# Patient Record
Sex: Male | Born: 1950 | Race: Black or African American | Hispanic: No | Marital: Married | Smoking: Never smoker
Health system: Southern US, Community
[De-identification: ages and names within clinical notes are randomized; demographics above are authoritative.]

## PROBLEM LIST (undated history)

## (undated) DIAGNOSIS — C61 Malignant neoplasm of prostate: Secondary | ICD-10-CM

## (undated) DIAGNOSIS — I10 Essential (primary) hypertension: Secondary | ICD-10-CM

---

## 2022-01-09 ENCOUNTER — Emergency Department (HOSPITAL_BASED_OUTPATIENT_CLINIC_OR_DEPARTMENT_OTHER)
Admission: EM | Admit: 2022-01-09 | Discharge: 2022-01-10 | Disposition: A | Discharge status: Home / Self Care | Payer: Medicare Other | Attending: Emergency Medicine | Admitting: Emergency Medicine

## 2022-01-09 ENCOUNTER — Emergency Department (HOSPITAL_COMMUNITY): Payer: Medicare Other

## 2022-01-09 ENCOUNTER — Other Ambulatory Visit: Payer: Self-pay

## 2022-01-09 ENCOUNTER — Encounter (HOSPITAL_BASED_OUTPATIENT_CLINIC_OR_DEPARTMENT_OTHER): Payer: Self-pay | Admitting: Emergency Medicine

## 2022-01-09 ENCOUNTER — Emergency Department (HOSPITAL_BASED_OUTPATIENT_CLINIC_OR_DEPARTMENT_OTHER): Payer: Medicare Other

## 2022-01-09 DIAGNOSIS — I10 Essential (primary) hypertension: Secondary | ICD-10-CM | POA: Diagnosis not present

## 2022-01-09 DIAGNOSIS — R791 Abnormal coagulation profile: Secondary | ICD-10-CM | POA: Diagnosis not present

## 2022-01-09 DIAGNOSIS — R262 Difficulty in walking, not elsewhere classified: Secondary | ICD-10-CM | POA: Insufficient documentation

## 2022-01-09 DIAGNOSIS — Z79899 Other long term (current) drug therapy: Secondary | ICD-10-CM | POA: Insufficient documentation

## 2022-01-09 DIAGNOSIS — R42 Dizziness and giddiness: Secondary | ICD-10-CM | POA: Insufficient documentation

## 2022-01-09 DIAGNOSIS — I6521 Occlusion and stenosis of right carotid artery: Secondary | ICD-10-CM | POA: Insufficient documentation

## 2022-01-09 DIAGNOSIS — R112 Nausea with vomiting, unspecified: Secondary | ICD-10-CM | POA: Diagnosis not present

## 2022-01-09 DIAGNOSIS — R270 Ataxia, unspecified: Secondary | ICD-10-CM

## 2022-01-09 HISTORY — DX: Malignant neoplasm of prostate: C61

## 2022-01-09 HISTORY — DX: Essential (primary) hypertension: I10

## 2022-01-09 LAB — CBC
HCT: 34.9 % — ABNORMAL LOW (ref 39.0–52.0)
Hemoglobin: 11.4 g/dL — ABNORMAL LOW (ref 13.0–17.0)
MCH: 29.2 pg (ref 26.0–34.0)
MCHC: 32.7 g/dL (ref 30.0–36.0)
MCV: 89.5 fL (ref 80.0–100.0)
Platelets: 207 10*3/uL (ref 150–400)
RBC: 3.9 MIL/uL — ABNORMAL LOW (ref 4.22–5.81)
RDW: 16.7 % — ABNORMAL HIGH (ref 11.5–15.5)
WBC: 5.2 10*3/uL (ref 4.0–10.5)
nRBC: 0 % (ref 0.0–0.2)

## 2022-01-09 LAB — DIFFERENTIAL
Abs Immature Granulocytes: 0.03 10*3/uL (ref 0.00–0.07)
Basophils Absolute: 0 10*3/uL (ref 0.0–0.1)
Basophils Relative: 0 %
Eosinophils Absolute: 0 10*3/uL (ref 0.0–0.5)
Eosinophils Relative: 0 %
Immature Granulocytes: 1 %
Lymphocytes Relative: 5 %
Lymphs Abs: 0.3 10*3/uL — ABNORMAL LOW (ref 0.7–4.0)
Monocytes Absolute: 0 10*3/uL — ABNORMAL LOW (ref 0.1–1.0)
Monocytes Relative: 0 %
Neutro Abs: 4.8 10*3/uL (ref 1.7–7.7)
Neutrophils Relative %: 94 %

## 2022-01-09 LAB — RAPID URINE DRUG SCREEN, HOSP PERFORMED
Amphetamines: NOT DETECTED
Barbiturates: NOT DETECTED
Benzodiazepines: NOT DETECTED
Cocaine: NOT DETECTED
Opiates: NOT DETECTED
Tetrahydrocannabinol: NOT DETECTED

## 2022-01-09 LAB — CBG MONITORING, ED: Glucose-Capillary: 108 mg/dL — ABNORMAL HIGH (ref 70–99)

## 2022-01-09 LAB — COMPREHENSIVE METABOLIC PANEL
ALT: 9 U/L (ref 0–44)
AST: 12 U/L — ABNORMAL LOW (ref 15–41)
Albumin: 3.8 g/dL (ref 3.5–5.0)
Alkaline Phosphatase: 94 U/L (ref 38–126)
Anion gap: 12 (ref 5–15)
BUN: 13 mg/dL (ref 8–23)
CO2: 26 mmol/L (ref 22–32)
Calcium: 9.3 mg/dL (ref 8.9–10.3)
Chloride: 102 mmol/L (ref 98–111)
Creatinine, Ser: 0.61 mg/dL (ref 0.61–1.24)
GFR, Estimated: >60 mL/min (ref >60)
Glucose, Bld: 107 mg/dL — ABNORMAL HIGH (ref 70–99)
Potassium: 3.6 mmol/L (ref 3.5–5.1)
Sodium: 140 mmol/L (ref 135–145)
Total Bilirubin: 0.5 mg/dL (ref 0.3–1.2)
Total Protein: 6.7 g/dL (ref 6.5–8.1)

## 2022-01-09 LAB — URINALYSIS, ROUTINE W REFLEX MICROSCOPIC
Bilirubin Urine: NEGATIVE
Glucose, UA: NEGATIVE mg/dL
Ketones, ur: 15 mg/dL — AB
Leukocytes,Ua: NEGATIVE
Nitrite: NEGATIVE
Protein, ur: NEGATIVE mg/dL
Specific Gravity, Urine: 1.02 (ref 1.005–1.030)
pH: 7.5 (ref 5.0–8.0)

## 2022-01-09 LAB — PROTIME-INR
INR: 1 (ref 0.8–1.2)
Prothrombin Time: 13.5 seconds (ref 11.4–15.2)

## 2022-01-09 LAB — URINALYSIS, MICROSCOPIC (REFLEX)

## 2022-01-09 LAB — TROPONIN I (HIGH SENSITIVITY): Troponin I (High Sensitivity): 9 ng/L (ref <18)

## 2022-01-09 LAB — APTT: aPTT: 26 seconds (ref 24–36)

## 2022-01-09 LAB — ETHANOL: Alcohol, Ethyl (B): <10 mg/dL (ref <10)

## 2022-01-09 MED ORDER — MECLIZINE HCL 25 MG PO TABS
12.5000 mg | ORAL_TABLET | Freq: Once | ORAL | Status: AC
  Administered 2022-01-09: 12.5 mg via ORAL
  Filled 2022-01-09: qty 1

## 2022-01-09 MED ORDER — ONDANSETRON HCL 4 MG/2ML IJ SOLN: 4.0000 mg | Freq: Once | INTRAMUSCULAR | Status: DC

## 2022-01-09 NOTE — ED Notes (Signed)
Gave pt urinal. Pt not able to give urine sample at present time, but is aware of need for sample

## 2022-01-09 NOTE — ED Provider Notes (Signed)
  Provider Note MRN:  [id number]  Arrival date & time: 01/10/22    ED Course and Medical Decision Making  Assumed care from Dr. Wyvonnia Dusky at shift change.  Persistent vertigo with nausea vomiting since yesterday awaiting MRI.  MRI is without acute stroke, there is some atherosclerotic disease of the cerebral vessels but not in the posterior circulation.  Patient feeling well, suspect peripheral vertigo, ambulating without issue, appropriate for discharge.  Procedures  Final Clinical Impressions(s) / ED Diagnoses     ICD-10-CM   1. Dizzy  R42       ED Discharge Orders          Ordered    meclizine (ANTIVERT) 25 MG tablet  3 times daily PRN        01/10/22 0329              Discharge Instructions      You were evaluated in the Emergency Department and after careful evaluation, we did not find any emergent condition requiring admission or further testing in the hospital.  Your exam/testing today was overall reassuring.  MRI did not show evidence of stroke.  You do have some blood vessel disease in the blood vessels of your brain.  Importantly follow-up with your primary care doctor to discuss management of this.  Your symptoms may be due to vertigo, you can take the meclizine medication as needed.  Please return to the Emergency Department if you experience any worsening of your condition.  Thank you for allowing [place] to be a part of your care.       Barth Kirks. Sedonia Small, [place] [email or web address]    Maudie Flakes, MD 01/10/22 [id number]

## 2022-01-09 NOTE — ED Provider Notes (Signed)
Patient sent from [facility] for MRI brain.  Developed dizziness, nausea, vomiting, difficulty walking yesterday at 4 PM.  Reports dizziness and vomiting have improved but still having difficulty walking.  Sent for MRI brain to rule out posterior circulation infarct.  Positive Romberg, ataxic gait, no ataxia on finger-to-nose.  Awaiting MRI at shift change.  Dr. Sedonia Small to assume care.    Ezequiel Essex, MD 01/09/22 2316

## 2022-01-09 NOTE — ED Provider Notes (Signed)
[facility] EMERGENCY DEPARTMENT Provider Note   CSN: [id number] Arrival date & time: 01/09/22  1525     History  Chief Complaint  Patient presents with   Dizziness    Luke Leblanc is a 71 y.o. male.  He has a history of prostate cancer on chemotherapy and hypertension.  He said he was at the grocery store yesterday when he acutely became dizzy and had unsteady gait.  Needed to grab onto things to ambulate.  Was associated with nausea and vomiting.  Symptoms persisted throughout the evening and he had to crawl up the stairs on his hands and knees to go to bed.  Symptoms remain today although he feels somewhat better.  Not associated with any tenderness blurry vision double vision numbness or weakness.  No speech difficulties.  No headache.  No trauma.  No recent illnesses no medication changes.  He gets chemotherapy every 3 weeks no recent change in any meds.  No prior history of stroke.  The history is provided by the patient.  Dizziness Quality:  Room spinning Severity:  Severe Onset quality:  Sudden Duration:  24 hours Timing:  Constant Progression:  Improving Chronicity:  New Relieved by:  Nothing Worsened by:  Nothing Ineffective treatments:  Lying down Associated symptoms: nausea and vomiting   Associated symptoms: no chest pain, no diarrhea, no headaches, no hearing loss, no shortness of breath, no syncope, no tinnitus, no vision changes and no weakness       Home Medications Prior to Admission medications   Not on File      Allergies    Patient has no known allergies.    Review of Systems   Review of Systems  Constitutional:  Negative for fever.  HENT:  Negative for hearing loss, sore throat and tinnitus.   Eyes:  Negative for visual disturbance.  Respiratory:  Negative for shortness of breath.   Cardiovascular:  Negative for chest pain and syncope.  Gastrointestinal:  Positive for nausea and vomiting. Negative for abdominal pain and diarrhea.   Genitourinary:  Negative for dysuria.  Musculoskeletal:  Positive for gait problem.  Skin:  Negative for rash.  Neurological:  Positive for dizziness. Negative for syncope, speech difficulty, weakness, numbness and headaches.   Physical Exam Updated Vital Signs BP (!) 192/112 (BP Location: Left Arm)   Pulse 99   Temp 98.1 F (36.7 C) (Oral)   Resp 18   SpO2 99%  Physical Exam Vitals and nursing note reviewed.  Constitutional:      General: He is not in acute distress.    Appearance: Normal appearance. He is well-developed.  HENT:     Head: Normocephalic and atraumatic.  Eyes:     Conjunctiva/sclera: Conjunctivae normal.  Cardiovascular:     Rate and Rhythm: Normal rate and regular rhythm.     Pulses: Normal pulses.     Heart sounds: Normal heart sounds. No murmur heard. Pulmonary:     Effort: Pulmonary effort is normal. No respiratory distress.     Breath sounds: Normal breath sounds.  Abdominal:     Palpations: Abdomen is soft.     Tenderness: There is no abdominal tenderness. There is no guarding or rebound.  Musculoskeletal:        General: No swelling.     Cervical back: Neck supple.  Skin:    General: Skin is warm and dry.     Capillary Refill: Capillary refill takes less than 2 seconds.  Neurological:     Mental  Status: He is alert.     Cranial Nerves: No cranial nerve deficit.     Sensory: No sensory deficit.     Motor: No weakness.     Coordination: Coordination normal.     Gait: Gait abnormal.     Comments: Patient finger-nose and heel shin were normal although he has a wide-based gait and he is unable to heel toe walk    ED Results / Procedures / Treatments   Labs (all labs ordered are listed, but only abnormal results are displayed) Labs Reviewed  CBC - Abnormal; Notable for the following components:      Result Value   RBC 3.90 (*)    Hemoglobin 11.4 (*)    HCT 34.9 (*)    RDW 16.7 (*)    All other components within normal limits  DIFFERENTIAL  - Abnormal; Notable for the following components:   Lymphs Abs 0.3 (*)    Monocytes Absolute 0.0 (*)    All other components within normal limits  COMPREHENSIVE METABOLIC PANEL - Abnormal; Notable for the following components:   Glucose, Bld 107 (*)    AST 12 (*)    All other components within normal limits  URINALYSIS, ROUTINE W REFLEX MICROSCOPIC - Abnormal; Notable for the following components:   Hgb urine dipstick SMALL (*)    Ketones, ur 15 (*)    All other components within normal limits  URINALYSIS, MICROSCOPIC (REFLEX) - Abnormal; Notable for the following components:   Bacteria, UA MANY (*)    All other components within normal limits  CBG MONITORING, ED - Abnormal; Notable for the following components:   Glucose-Capillary 108 (*)    All other components within normal limits  URINE CULTURE  ETHANOL  PROTIME-INR  APTT  RAPID URINE DRUG SCREEN, HOSP PERFORMED  TROPONIN I (HIGH SENSITIVITY)  TROPONIN I (HIGH SENSITIVITY)    EKG EKG Interpretation  Date/Time:  Sunday Jan 09 2022 15:44:21 EDT Ventricular Rate:  79 PR Interval:  192 QRS Duration: 104 QT Interval:  418 QTC Calculation: 480 R Axis:   -28 Text Interpretation: Sinus rhythm Borderline left axis deviation Abnormal R-wave progression, early transition ST elevation, consider inferior injury Borderline prolonged QT interval No old tracing to compare Confirmed by Aletta Edouard [phone]) on 01/09/2022 4:02:15 PM  Radiology CT Head Wo Contrast  Result Date: 01/09/2022 CLINICAL DATA:  Dizziness, generalized weakness, loss of balance since 4 p.m., history of prostate cancer EXAM: CT HEAD WITHOUT CONTRAST TECHNIQUE: Contiguous axial images were obtained from the base of the skull through the vertex without intravenous contrast. RADIATION DOSE REDUCTION: This exam was performed according to the departmental dose-optimization program which includes automated exposure control, adjustment of the mA and/or kV according to  patient size and/or use of iterative reconstruction technique. COMPARISON:  03/27/2020 FINDINGS: Brain: Confluent hypodensities are seen throughout the periventricular and subcortical white matter, consistent with chronic small vessel ischemic changes. Frontal lobe hypodensities are unchanged since prior facial CT 03/27/2020. No evidence of acute infarct or hemorrhage. Lateral ventricles and midline structures are otherwise unremarkable. No acute extra-axial fluid collections. No mass effect. Vascular: Stable atherosclerosis of the internal carotid arteries. No hyperdense vessel. Skull: Normal. Negative for fracture or focal lesion. Sinuses/Orbits: Mild mucosal thickening left ethmoid air cells. No gas fluid levels. Other: None. IMPRESSION: 1. Likely chronic small-vessel ischemic changes throughout the periventricular and subcortical white matter. 2. No acute intracranial process. Electronically Signed   By: Randa Ngo M.D.   On: 01/09/2022 16:26  Procedures Procedures    Medications Ordered in ED Medications  meclizine (ANTIVERT) tablet 12.5 mg (has no administration in time range)    ED Course/ Medical Decision Making/ A&P Clinical Course as of 01/09/22 1754  Sun Jan 09, 2022  1641 Reviewed case with Dr. Quinn Axe neurology.  She agrees that an MRI would be indicated.  Symptoms been going on long enough that if MRI is negative he can be considered peripheral and discharged if symptoms control.  Discussed with Dr. Wyvonnia Dusky ED physician at [facility] who accepts the patient for transfer. [MB]  1700 Reviewed work-up with patient and family.  They would rather go by private vehicle over to [facility] to get the MRI.  We will give dose of meclizine here. [MB]    Clinical Course User Index [MB] Hayden Rasmussen, MD                           Medical Decision Making Amount and/or Complexity of Data Reviewed Labs: ordered. Radiology: ordered.  Risk Prescription drug management.  This patient complains of  dizziness vertigo unsteady gait nausea vomiting; this involves an extensive number of treatment Options and is a complaint that carries with it a high risk of complications and morbidity. The differential includes vertigo, posterior circulation stroke, bleed, tumor  I ordered, reviewed and interpreted labs, which included CBC with normal white count, hemoglobin slightly low unclear priors, chemistries fairly normal, urinalysis with some ketones, troponin unremarkable I ordered medication oral meclizine and reviewed PMP when indicated. I ordered imaging studies which included CT head and MRI brain and I independently    visualized and interpreted imaging which CT showed some white matter changes.  Will need transfer for MRI. Additional history obtained from patient's family members Previous records obtained and reviewed in epic no recent admissions I consulted Dr. Quinn Axe neurology and discussed lab and imaging findings and discussed disposition.  Cardiac monitoring reviewed, sinus rhythm Social determinants considered, no significant barriers Critical Interventions: None  After the interventions stated above, I reevaluated the patient and found patient still to be symptomatic with any type of ambulation but very comfortable in bed Admission and further testing considered, will need transfer over to [facility] to get an MRI.  Final disposition dependent on MRI results          Final Clinical Impression(s) / ED Diagnoses Final diagnoses:  Dizzy    Rx / DC Orders ED Discharge Orders          Ordered    meclizine (ANTIVERT) 25 MG tablet  3 times daily PRN        01/10/22 0329              Hayden Rasmussen, MD 01/10/22 [id number]

## 2022-01-09 NOTE — ED Triage Notes (Addendum)
Pt reports dizziness, generalized weakness, and balance issues that started yesterday while at the grocery store around 4 PM.

## 2022-01-09 NOTE — ED Notes (Signed)
Pt transported to CT ?

## 2022-01-09 NOTE — Discharge Instructions (Addendum)
You were evaluated in the Emergency Department and after careful evaluation, we did not find any emergent condition requiring admission or further testing in the hospital.  Your exam/testing today was overall reassuring.  MRI did not show evidence of stroke.  You do have some blood vessel disease in the blood vessels of your brain.  Importantly follow-up with your primary care doctor to discuss management of this.  Your symptoms may be due to vertigo, you can take the meclizine medication as needed.  Please return to the Emergency Department if you experience any worsening of your condition.  Thank you for allowing [place] to be a part of your care.

## 2022-01-09 NOTE — ED Notes (Signed)
Updated pt on plan of care, pt expressed being upset at waiting, RN informed pt of ER process. Pt accepting

## 2022-01-10 ENCOUNTER — Emergency Department (HOSPITAL_COMMUNITY): Payer: Medicare Other

## 2022-01-10 MED ORDER — MECLIZINE HCL 25 MG PO TABS: 25.0000 mg | ORAL_TABLET | Freq: Three times a day (TID) | ORAL | 0 refills | Status: AC | PRN

## 2022-01-10 MED ORDER — GADOBUTROL 1 MMOL/ML IV SOLN
8.0000 mL | Freq: Once | INTRAVENOUS | Status: AC | PRN
  Administered 2022-01-10: 8 mL via INTRAVENOUS

## 2022-01-10 NOTE — ED Notes (Signed)
Patient transported to MRI 

## 2022-01-17 ENCOUNTER — Other Ambulatory Visit (HOSPITAL_COMMUNITY): Payer: Self-pay | Admitting: Hematology and Oncology

## 2022-01-17 DIAGNOSIS — C61 Malignant neoplasm of prostate: Secondary | ICD-10-CM

## 2022-01-17 DIAGNOSIS — C7951 Secondary malignant neoplasm of bone: Secondary | ICD-10-CM

## 2022-01-20 ENCOUNTER — Other Ambulatory Visit (HOSPITAL_COMMUNITY): Payer: Self-pay | Admitting: Hematology and Oncology

## 2022-01-20 DIAGNOSIS — C7951 Secondary malignant neoplasm of bone: Secondary | ICD-10-CM

## 2022-01-20 DIAGNOSIS — C61 Malignant neoplasm of prostate: Secondary | ICD-10-CM

## 2022-01-31 ENCOUNTER — Other Ambulatory Visit (HOSPITAL_COMMUNITY): Payer: Self-pay | Admitting: Hematology and Oncology

## 2022-01-31 ENCOUNTER — Ambulatory Visit (HOSPITAL_COMMUNITY)
Admission: RE | Admit: 2022-01-31 | Discharge: 2022-01-31 | Disposition: A | Discharge status: Home / Self Care | Payer: Medicare Other | Source: Ambulatory Visit | Attending: Hematology and Oncology | Admitting: Hematology and Oncology

## 2022-01-31 DIAGNOSIS — C61 Malignant neoplasm of prostate: Secondary | ICD-10-CM

## 2022-01-31 DIAGNOSIS — C7951 Secondary malignant neoplasm of bone: Secondary | ICD-10-CM | POA: Insufficient documentation

## 2022-03-03 NOTE — Written Directive (Addendum)
  PLUVICTO  THERAPY   RADIOPHARMACEUTICAL: Lutetium 177 vipivotide tetraxetan (Pluvicto)     PRESCRIBED DOSE FOR ADMINISTRATION:  200 mCi   ROUTE OFADMINISTRATION:  IV   DIAGNOSIS:  Prostate cancer metastatic to bone   REFERRING PHYSICIAN: Jacqulyn Ducking MD   TREATMENT #: 1   ADDITIONAL PHYSICIAN COMMENTS/NOTES:    AUTHORIZED USER SIGNATURE & TIME STAMP: Rennis Golden, MD   03/08/22    8:55 AM

## 2022-03-09 ENCOUNTER — Ambulatory Visit (HOSPITAL_COMMUNITY)
Admission: RE | Admit: 2022-03-09 | Discharge: 2022-03-09 | Disposition: A | Discharge status: Home / Self Care | Payer: Medicare Other | Source: Ambulatory Visit | Attending: Hematology and Oncology | Admitting: Hematology and Oncology

## 2022-03-09 DIAGNOSIS — C61 Malignant neoplasm of prostate: Secondary | ICD-10-CM | POA: Insufficient documentation

## 2022-03-09 DIAGNOSIS — C7951 Secondary malignant neoplasm of bone: Secondary | ICD-10-CM | POA: Insufficient documentation

## 2022-03-09 LAB — CBC WITH DIFFERENTIAL/PLATELET
Abs Immature Granulocytes: 0.05 10*3/uL (ref 0.00–0.07)
Basophils Absolute: 0 10*3/uL (ref 0.0–0.1)
Basophils Relative: 0 %
Eosinophils Absolute: 0.1 10*3/uL (ref 0.0–0.5)
Eosinophils Relative: 1 %
HCT: 36.1 % — ABNORMAL LOW (ref 39.0–52.0)
Hemoglobin: 11.6 g/dL — ABNORMAL LOW (ref 13.0–17.0)
Immature Granulocytes: 1 %
Lymphocytes Relative: 21 %
Lymphs Abs: 1.4 10*3/uL (ref 0.7–4.0)
MCH: 28.8 pg (ref 26.0–34.0)
MCHC: 32.1 g/dL (ref 30.0–36.0)
MCV: 89.6 fL (ref 80.0–100.0)
Monocytes Absolute: 0.4 10*3/uL (ref 0.1–1.0)
Monocytes Relative: 6 %
Neutro Abs: 4.6 10*3/uL (ref 1.7–7.7)
Neutrophils Relative %: 71 %
Platelets: 231 10*3/uL (ref 150–400)
RBC: 4.03 MIL/uL — ABNORMAL LOW (ref 4.22–5.81)
RDW: 15.2 % (ref 11.5–15.5)
WBC: 6.5 10*3/uL (ref 4.0–10.5)
nRBC: 0 % (ref 0.0–0.2)

## 2022-03-09 LAB — BASIC METABOLIC PANEL
Anion gap: 9 (ref 5–15)
BUN: 12 mg/dL (ref 8–23)
CO2: 27 mmol/L (ref 22–32)
Calcium: 8.2 mg/dL — ABNORMAL LOW (ref 8.9–10.3)
Chloride: 107 mmol/L (ref 98–111)
Creatinine, Ser: 0.7 mg/dL (ref 0.61–1.24)
GFR, Estimated: >60 mL/min (ref >60)
Glucose, Bld: 72 mg/dL (ref 70–99)
Potassium: 3.2 mmol/L — ABNORMAL LOW (ref 3.5–5.1)
Sodium: 143 mmol/L (ref 135–145)

## 2022-03-09 MED ORDER — SODIUM CHLORIDE 0.9 % IV BOLUS: 1000.0000 mL | Freq: Once | INTRAVENOUS | Status: DC

## 2022-03-09 MED ORDER — LUTETIUM LU 177 VIPIVOTIDE TET 1000 MBQ/ML IV SOLN
198.4000 | Freq: Once | INTRAVENOUS | Status: AC
  Administered 2022-03-09: 198.4 via INTRAVENOUS

## 2022-03-09 NOTE — Progress Notes (Signed)
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  Patient status post androgen deprivation and taxane chemotherapy..  Radiotracer avid metastatic prostate carcinoma identified on PSMA PET scan within the bones and pelvic lymph nodes.] EXAM: NUCLEAR MEDICINE PLUVICTO INJECTION TECHNIQUE: Infusion: The nuclear medicine technologist and I personally verified the dose activity to be delivered as specified in the written directive, and verified the patient identification via 2 separate methods.  Initial flush of the intravenous catheter was performed was sterile saline. The dose syringe was connected to the catheter and the Lu-177 Pluvicto administered over a 1 to 10 min infusion. Single 10 cc  lushes with normal saline follow the dose. No complications were noted. The entire IV tubing, venocatheter, stopcock and syringes was removed in total, placed in a disposal bag and sent for assay of the residual activity, which will be reported at a later time in our EMR by the physics staff. Pressure was applied to the venipuncture site, and a compression bandage placed. Patient monitored for 1 hour following infusion.   Radiation Safety personnel were present to perform the discharge survey, as detailed on their documentation. After a short period of observation, the patient had his IV removed. RADIOPHARMACEUTICALS: [198.4] microcuries Lu-177 PLUVICTO FINDINGS: Current Infusion: [1] Planned Infusions: 6   Patient presented to nuclear medicine for treatment. The patient's most recent blood counts were reviewed and remains a good candidate to proceed with Lu-177 Pluvicto.  Patient is slightly anemic.  Red blood cell counts will be followed.  The patient was situated in an infusion suite with a contact barrier placed under the arm. Intravenous access was established, using sterile technique, and a normal saline infusion from a syringe was started.     Micro-dosimetry: The prescribed radiation activity was assayed and confirmed to be within specified tolerance. IMPRESSION: Current Infusion: [1] Planned Infusions: 6   [The patient tolerated the infusion well. The patient will return in 6 weeks for ongoing care.]

## 2022-03-10 ENCOUNTER — Other Ambulatory Visit (HOSPITAL_COMMUNITY): Payer: Medicare Other

## 2022-04-13 NOTE — Written Directive (Cosign Needed)
  PLUVICTO  THERAPY   RADIOPHARMACEUTICAL: Lutetium 177 vipivotide tetraxetan (Pluvicto)     PRESCRIBED DOSE FOR ADMINISTRATION:  200 mCi   ROUTE OFADMINISTRATION:  IV   DIAGNOSIS:  PROSTATE CANCER   REFERRING PHYSICIAN: JASON HUFF   TREATMENT #: 2   ADDITIONAL PHYSICIAN COMMENTS/NOTES:   AUTHORIZED USER SIGNATURE & TIME STAMP: Rennis Golden, MD   04/14/22    8:44 AM

## 2022-04-21 ENCOUNTER — Ambulatory Visit (HOSPITAL_COMMUNITY)
Admission: RE | Admit: 2022-04-21 | Discharge: 2022-04-21 | Disposition: A | Discharge status: Home / Self Care | Payer: Medicare Other | Source: Ambulatory Visit | Attending: Hematology and Oncology | Admitting: Hematology and Oncology

## 2022-04-21 DIAGNOSIS — C61 Malignant neoplasm of prostate: Secondary | ICD-10-CM | POA: Diagnosis present

## 2022-04-21 DIAGNOSIS — C7951 Secondary malignant neoplasm of bone: Secondary | ICD-10-CM | POA: Diagnosis present

## 2022-04-21 MED ORDER — SODIUM CHLORIDE 0.9 % IV BOLUS: 1000.0000 mL | Freq: Once | INTRAVENOUS | Status: DC

## 2022-04-21 MED ORDER — LUTETIUM LU 177 VIPIVOTIDE TET 1000 MBQ/ML IV SOLN
200.0000 | Freq: Once | INTRAVENOUS | Status: AC
  Administered 2022-04-21: 197.83 via INTRAVENOUS

## 2022-04-21 NOTE — Progress Notes (Signed)
RADIOPHARMACEUTICALS: [198] microcuries Lu-177 PLUVICTO FINDINGS: Current Infusion: [2] Planned Infusions: 6  Ref Range & Units 10 d ago  WBC 4.4 - 11.0 x 10*3/uL 5.8   RBC 4.50 - 5.90 x 10*6/uL 4.24 Low    Hemoglobin 14.0 - 17.5 G/DL 12.5 Low    Hematocrit 41.5 - 50.4 % 37.7 Low    MCV 80.0 - 96.0 FL 89.0   MCH 27.5 - 33.2 PG 29.5   MCHC 33.0 - 37.0 G/DL 33.1   RDW 12.3 - 17.0 % 16.9   Platelets 150 - 450 X 10*3/uL 160       Patient presented to nuclear medicine for treatment.  Patient reports no adverse effects from initial therapy.  The patient's most recent blood counts were reviewed and remains a good candidate to proceed with Lu-177 Pluvicto.  No evidence of mild suppression, renal toxicity or hepatic toxicity.  Hemoglobin slightly increased in the interval.  The patient was situated in an infusion suite with a contact barrier placed under the arm. Intravenous access was established, using sterile technique, and a normal saline infusion from a syringe was started.    Micro-dosimetry: The prescribed radiation activity was assayed and confirmed to be within specified tolerance. IMPRESSION: Current Infusion: [2] Planned Infusions: 6   [The patient tolerated the infusion well. The patient will return in 6 weeks  for ongoing care.]

## 2022-04-21 NOTE — Progress Notes (Signed)
Dr. Leonia Reeves is at the bedside at this time. Pt is alert, calm and pleasant and has no s/s of distress. Will continue to monitor and tx pt according to MD orders.

## 2022-06-01 NOTE — Written Directive (Addendum)
MOLECULAR IMAGING AND THERAPEUTICS WRITTEN DIRECTIVE   PATIENT NAME: Luke Leblanc  PT DOB:   02/23/1951                                              MRN: [id number]  ---------------------------------------------------------------------------------------------------------------------   PLUVICTO  THERAPY   RADIOPHARMACEUTICAL: Lutetium 177 vipivotide tetraxetan (Pluvicto)     PRESCRIBED DOSE FOR ADMINISTRATION:  200 mCi   ROUTE OFADMINISTRATION:  IV   DIAGNOSIS:  Prostate cancer    REFERRING PHYSICIAN: Jacqulyn Ducking   TREATMENT #: 3   ADDITIONAL PHYSICIAN COMMENTS/NOTES:   AUTHORIZED USER SIGNATURE & TIME STAMP: Rennis Golden, MD   06/01/22    8:53 AM

## 2022-06-02 ENCOUNTER — Ambulatory Visit (HOSPITAL_COMMUNITY)
Admission: RE | Admit: 2022-06-02 | Discharge: 2022-06-02 | Disposition: A | Discharge status: Home / Self Care | Payer: Medicare Other | Source: Ambulatory Visit | Attending: Hematology and Oncology | Admitting: Hematology and Oncology

## 2022-06-02 DIAGNOSIS — C61 Malignant neoplasm of prostate: Secondary | ICD-10-CM | POA: Diagnosis present

## 2022-06-02 DIAGNOSIS — C7951 Secondary malignant neoplasm of bone: Secondary | ICD-10-CM | POA: Diagnosis present

## 2022-06-02 MED ORDER — SODIUM CHLORIDE 0.9 % IV BOLUS: 1000.0000 mL | Freq: Once | INTRAVENOUS | Status: DC

## 2022-06-02 MED ORDER — LUTETIUM LU 177 VIPIVOTIDE TET 1000 MBQ/ML IV SOLN
201.9000 | Freq: Once | INTRAVENOUS | Status: AC
  Administered 2022-06-02: 201.9 via INTRAVENOUS

## 2022-06-25 ENCOUNTER — Other Ambulatory Visit: Payer: Self-pay

## 2022-06-25 ENCOUNTER — Emergency Department (HOSPITAL_BASED_OUTPATIENT_CLINIC_OR_DEPARTMENT_OTHER)
Admission: EM | Admit: 2022-06-25 | Discharge: 2022-06-25 | Disposition: A | Discharge status: Home / Self Care | Payer: Medicare Other | Attending: Emergency Medicine | Admitting: Emergency Medicine

## 2022-06-25 ENCOUNTER — Encounter (HOSPITAL_BASED_OUTPATIENT_CLINIC_OR_DEPARTMENT_OTHER): Payer: Self-pay | Admitting: Emergency Medicine

## 2022-06-25 ENCOUNTER — Emergency Department (HOSPITAL_BASED_OUTPATIENT_CLINIC_OR_DEPARTMENT_OTHER): Payer: Medicare Other

## 2022-06-25 DIAGNOSIS — I10 Essential (primary) hypertension: Secondary | ICD-10-CM | POA: Diagnosis not present

## 2022-06-25 DIAGNOSIS — M542 Cervicalgia: Secondary | ICD-10-CM | POA: Diagnosis present

## 2022-06-25 DIAGNOSIS — Z8546 Personal history of malignant neoplasm of prostate: Secondary | ICD-10-CM | POA: Insufficient documentation

## 2022-06-25 DIAGNOSIS — D649 Anemia, unspecified: Secondary | ICD-10-CM | POA: Diagnosis not present

## 2022-06-25 DIAGNOSIS — H6693 Otitis media, unspecified, bilateral: Secondary | ICD-10-CM | POA: Insufficient documentation

## 2022-06-25 DIAGNOSIS — H6690 Otitis media, unspecified, unspecified ear: Secondary | ICD-10-CM

## 2022-06-25 LAB — CBC WITH DIFFERENTIAL/PLATELET
Abs Immature Granulocytes: 0.02 10*3/uL (ref 0.00–0.07)
Basophils Absolute: 0 10*3/uL (ref 0.0–0.1)
Basophils Relative: 0 %
Eosinophils Absolute: 0.1 10*3/uL (ref 0.0–0.5)
Eosinophils Relative: 1 %
HCT: 32.4 % — ABNORMAL LOW (ref 39.0–52.0)
Hemoglobin: 10.8 g/dL — ABNORMAL LOW (ref 13.0–17.0)
Immature Granulocytes: 0 %
Lymphocytes Relative: 13 %
Lymphs Abs: 0.8 10*3/uL (ref 0.7–4.0)
MCH: 30.2 pg (ref 26.0–34.0)
MCHC: 33.3 g/dL (ref 30.0–36.0)
MCV: 90.5 fL (ref 80.0–100.0)
Monocytes Absolute: 0.5 10*3/uL (ref 0.1–1.0)
Monocytes Relative: 8 %
Neutro Abs: 4.7 10*3/uL (ref 1.7–7.7)
Neutrophils Relative %: 78 %
Platelets: 212 10*3/uL (ref 150–400)
RBC: 3.58 MIL/uL — ABNORMAL LOW (ref 4.22–5.81)
RDW: 13.7 % (ref 11.5–15.5)
WBC: 6 10*3/uL (ref 4.0–10.5)
nRBC: 0 % (ref 0.0–0.2)

## 2022-06-25 LAB — COMPREHENSIVE METABOLIC PANEL
ALT: 11 U/L (ref 0–44)
AST: 15 U/L (ref 15–41)
Albumin: 3.6 g/dL (ref 3.5–5.0)
Alkaline Phosphatase: 85 U/L (ref 38–126)
Anion gap: 8 (ref 5–15)
BUN: 10 mg/dL (ref 8–23)
CO2: 24 mmol/L (ref 22–32)
Calcium: 8.9 mg/dL (ref 8.9–10.3)
Chloride: 107 mmol/L (ref 98–111)
Creatinine, Ser: 0.78 mg/dL (ref 0.61–1.24)
GFR, Estimated: >60 mL/min (ref >60)
Glucose, Bld: 90 mg/dL (ref 70–99)
Potassium: 3.5 mmol/L (ref 3.5–5.1)
Sodium: 139 mmol/L (ref 135–145)
Total Bilirubin: 0.4 mg/dL (ref 0.3–1.2)
Total Protein: 6.7 g/dL (ref 6.5–8.1)

## 2022-06-25 LAB — TROPONIN I (HIGH SENSITIVITY): Troponin I (High Sensitivity): 5 ng/L (ref <18)

## 2022-06-25 MED ORDER — AMOXICILLIN-POT CLAVULANATE 875-125 MG PO TABS: 1.0000 | ORAL_TABLET | Freq: Two times a day (BID) | ORAL | 0 refills | Status: AC

## 2022-06-25 MED ORDER — KETOROLAC TROMETHAMINE 30 MG/ML IJ SOLN
15.0000 mg | Freq: Once | INTRAMUSCULAR | Status: AC
  Administered 2022-06-25: 15 mg via INTRAVENOUS
  Filled 2022-06-25: qty 1

## 2022-06-25 MED ORDER — HEPARIN SOD (PORK) LOCK FLUSH 100 UNIT/ML IV SOLN
500.0000 [IU] | Freq: Once | INTRAVENOUS | Status: AC
  Administered 2022-06-25: 500 [IU]
  Filled 2022-06-25: qty 5

## 2022-06-25 MED ORDER — IOHEXOL 300 MG/ML  SOLN
75.0000 mL | Freq: Once | INTRAMUSCULAR | Status: AC | PRN
  Administered 2022-06-25: 75 mL via INTRAVENOUS

## 2022-06-25 NOTE — ED Triage Notes (Signed)
Pt arrives pov, steady gait, c/o LT side neck pain with tenderness and sore throat since yesterday. Painful to swallow

## 2022-06-25 NOTE — ED Provider Notes (Signed)
[facility] EMERGENCY DEPARTMENT Provider Note   CSN: [id number] Arrival date & time: 06/25/22  1556     History  Chief Complaint  Patient presents with   Neck Pain    Luke Leblanc is a 71 y.o. male.   Neck Pain   71 year old male presents emergency department with complaints of right-sided neck pain.  Patient states his symptoms began yesterday when he was working on his lawnmower abruptly.  He notes worsening symptoms since onset.  He took [place] powder at home which relieved his symptoms temporarily.  Presents emergency department for further evaluation.  Denies headache, dizziness, lightheadedness, nasal congestion, chest pain, shortness of breath.  He reports some difficulty swallowing secondary to pain he also notes that his mouth has been slightly more dry since symptom onset.  Denies any direct trauma to the neck.  Past medical history significant for hypertension, prostate cancer  Home Medications Prior to Admission medications   Medication Sig Start Date End Date Taking? Authorizing Provider  amoxicillin-clavulanate (AUGMENTIN) 875-125 MG tablet Take 1 tablet by mouth every 12 (twelve) hours. 06/25/22  Yes Dion Saucier A, PA  cilostazol (PLETAL) 100 MG tablet Take 100 mg by mouth 2 (two) times daily. 11/09/21   [provider]  latanoprost (XALATAN) 0.005 % ophthalmic solution Place 1 drop into both eyes at bedtime. 08/26/21   [provider]  meclizine (ANTIVERT) 25 MG tablet Take 1 tablet (25 mg total) by mouth 3 (three) times daily as needed for dizziness. 01/10/22   Maudie Flakes, MD      Allergies    Patient has no known allergies.    Review of Systems   Review of Systems  Musculoskeletal:  Positive for neck pain.  All other systems reviewed and are negative.   Physical Exam Updated Vital Signs BP (!) 140/89   Pulse 90   Temp 98.5 F (36.9 C) (Oral)   Resp 14   Ht '6\' 1"'$  (1.854 m)   Wt 77.1 kg   SpO2 100%   BMI 22.43  kg/m  Physical Exam Vitals and nursing note reviewed.  Constitutional:      General: He is not in acute distress.    Appearance: He is well-developed.  HENT:     Head: Normocephalic and atraumatic.     Comments: No obvious auscultatory bruits of carotid arteries.  Patient is tender to palpation of right submandibular area.  Minimal to no right swelling of said area.  No overlying skin abnormalities noted.  Uvula midline rises symmetrically with phonation.  Tonsils 01+ bilaterally.  No obvious posterior pharyngeal erythema noted.  Patient has dental implants upper and lower with no obvious abnormalities in the oropharynx.    Right Ear: Tympanic membrane is erythematous.     Left Ear: Tympanic membrane is erythematous.  Eyes:     Conjunctiva/sclera: Conjunctivae normal.  Cardiovascular:     Rate and Rhythm: Normal rate and regular rhythm.     Heart sounds: No murmur heard. Pulmonary:     Effort: Pulmonary effort is normal. No respiratory distress.     Breath sounds: Normal breath sounds.  Abdominal:     Palpations: Abdomen is soft.     Tenderness: There is no abdominal tenderness.  Musculoskeletal:        General: No swelling.     Cervical back: Neck supple.  Skin:    General: Skin is warm and dry.     Capillary Refill: Capillary refill takes less than 2 seconds.  Neurological:     Mental Status: He is alert.  Psychiatric:        Mood and Affect: Mood normal.     ED Results / Procedures / Treatments   Labs (all labs ordered are listed, but only abnormal results are displayed) Labs Reviewed  CBC WITH DIFFERENTIAL/PLATELET - Abnormal; Notable for the following components:      Result Value   RBC 3.58 (*)    Hemoglobin 10.8 (*)    HCT 32.4 (*)    All other components within normal limits  COMPREHENSIVE METABOLIC PANEL  TROPONIN I (HIGH SENSITIVITY)  TROPONIN I (HIGH SENSITIVITY)    EKG EKG Interpretation  Date/Time:  Saturday June 25 2022 17:57:24 EST Ventricular  Rate:  81 PR Interval:  192 QRS Duration: 97 QT Interval:  389 QTC Calculation: 452 R Axis:   -25 Text Interpretation: Sinus rhythm Borderline left axis deviation Abnormal R-wave progression, early transition No significant change since last tracing Confirmed by Gareth Morgan [phone]) on 06/25/2022 6:56:14 PM  Radiology CT Soft Tissue Neck W Contrast  Result Date: 06/25/2022 CLINICAL DATA:  Neck pain and sore throat EXAM: CT NECK WITH CONTRAST TECHNIQUE: Multidetector CT imaging of the neck was performed using the standard protocol following the bolus administration of intravenous contrast. RADIATION DOSE REDUCTION: This exam was performed according to the departmental dose-optimization program which includes automated exposure control, adjustment of the mA and/or kV according to patient size and/or use of iterative reconstruction technique. CONTRAST:  62m OMNIPAQUE IOHEXOL 300 MG/ML  SOLN COMPARISON:  None Available. FINDINGS: PHARYNX AND LARYNX: The nasopharynx, oropharynx and larynx are normal. Visible portions of the oral cavity, tongue base and floor of mouth are normal. Normal epiglottis, vallecula and pyriform sinuses. The larynx is normal. No retropharyngeal abscess, effusion or lymphadenopathy. SALIVARY GLANDS: Normal parotid, submandibular and sublingual glands. THYROID: Normal. LYMPH NODES: No enlarged or abnormal density lymph nodes. VASCULAR: Carotid and aortic atherosclerotic calcification. LIMITED INTRACRANIAL: Normal. VISUALIZED ORBITS: Normal. MASTOIDS AND VISUALIZED PARANASAL SINUSES: No fluid levels or advanced mucosal thickening. No mastoid effusion. SKELETON: No bony spinal canal stenosis. No lytic or blastic lesions. UPPER CHEST: Right apical bullous emphysema with posterior scarring and possible postsurgical change. OTHER: None. IMPRESSION: 1. No acute abnormality of the neck. Aortic Atherosclerosis (ICD10-I70.0) and Emphysema (ICD10-J43.9). Electronically Signed   By: KUlyses JarredM.D.   On: 06/25/2022 18:57    Procedures Procedures    Medications Ordered in ED Medications  ketorolac (TORADOL) 30 MG/ML injection 15 mg (15 mg Intravenous Given 06/25/22 1724)  iohexol (OMNIPAQUE) 300 MG/ML solution 75 mL (75 mLs Intravenous Contrast Given 06/25/22 1816)    ED Course/ Medical Decision Making/ A&P                           Medical Decision Making Amount and/or Complexity of Data Reviewed Labs: ordered. Radiology: ordered.  Risk Prescription drug management.   This patient presents to the ED for concern of right neck pain, this involves an extensive number of treatment options, and is a complaint that carries with it a high risk of complications and morbidity.  The differential diagnosis includes carotid artery dissection, muscular strain, sialadenitis, sialolithiasis, cellulitis, erysipelas, otitis media   Co morbidities that complicate the patient evaluation  See HPI   Additional history obtained:  Additional history obtained from EMR External records from outside source obtained and reviewed including hospital records   Lab Tests:  I Ordered, and personally  interpreted labs.  The pertinent results include: No leukocytosis noted.  Mild evidence of anemia with a hemoglobin of 10.8 which is decreased from hemoglobin performed 3 months ago 11.6.  Patient seems to be having steadily downtrending hemoglobin.  No electrolyte abnormalities noted.  Renal function within normal limits.  No transaminitis noted.  Initial troponin of 5.   Imaging Studies ordered:  I ordered imaging studies including CT soft tissue neck. I independently visualized and interpreted imaging which showed no acute abnormalities in the neck.  Aortic atherosclerosis.  Given centimeter changes of lungs. I agree with the radiologist interpretation   Cardiac Monitoring: / EKG:  The patient was maintained on a cardiac monitor.  I personally viewed and interpreted the cardiac  monitored which showed an underlying rhythm of: Sinus rhythm   Consultations Obtained:  Attending physician Dr. Billy Fischer who assessed the patient independently and was agreement with treatment plan.  Problem List / ED Course / Critical interventions / Medication management  Neck pain I ordered medication including Toradol for pain   Reevaluation of the patient after these medicines showed that the patient improved I have reviewed the patients home medicines and have made adjustments as needed   Social Determinants of Health:  Denies tobacco, illicit drug use.   Test / Admission - Considered:  Otitis media/neck pain Vitals signs significant for hypertension with a blood pressure 140/89. Otherwise within normal range and stable throughout visit. Laboratory/imaging studies significant for: See above Reassuring the patient's symptoms significantly improved with administration of Toradol.  Unlikely carotid artery dissection.  Given area of tenderness as well as pain, somewhat concerning for sialadenitis vs sialolithiasis so CT imaging of the neck was pursued.  CT imaging negative.  Patient noted significant improvement of symptoms with Toradol.  His exam showed bilateral erythematous TMs consistent with otitis media.  Will place on oral antibiotics outpatient.  Patient also noted drop in hemoglobin of 0.3 over the past 3 months.  Patient currently denying melena/hematochezia.  Patient recommended follow-up with PCP for repeat blood study to trend hemoglobin.  Symptomatic therapy at home recommended with Tylenol/Motrin as needed for pain.  Close follow-up with PCP recommended in 5 to 7 days.  Treatment plan discussed with patient he did not dressing was agreeable to said plan. Worrisome signs and symptoms were discussed with the patient, and the patient acknowledged understanding to return to the ED if noticed. Patient was stable upon discharge.          Final Clinical Impression(s) /  ED Diagnoses Final diagnoses:  Acute otitis media, unspecified otitis media type  Neck pain  Anemia, unspecified type    Rx / DC Orders ED Discharge Orders          Ordered    amoxicillin-clavulanate (AUGMENTIN) 875-125 MG tablet  Every 12 hours        06/25/22 1910              Wilnette Kales, [place] 06/25/22 1910    Gareth Morgan, MD 06/27/22 1309

## 2022-06-25 NOTE — ED Notes (Signed)
D/c paperwork reviewed with pt, including prescription. No questions or concerns at time of d/c. Pt ambulatory to ED exit without assistance, accompanied by spouse.

## 2022-06-25 NOTE — ED Notes (Signed)
Pts port accessed per his request.

## 2022-06-25 NOTE — Discharge Instructions (Addendum)
The work-up today was overall reassuring.  We will prescribe antibiotics for your ear infection.  Take twice daily as directed.  Take Tylenol/Motrin as needed at home for pain.  Follow-up with primary care regarding your drop in red blood cell count.  This needs to be reevaluated outpatient with additional laboratory studies.  Please do not hesitate to return to emergency department for worrisome signs and symptoms we discussed become apparent.

## 2022-07-12 NOTE — Written Directive (Cosign Needed)
  PLUVICTO  THERAPY   RADIOPHARMACEUTICAL: Lutetium 177 vipivotide tetraxetan (Pluvicto)     PRESCRIBED DOSE FOR ADMINISTRATION:  200 mCi   ROUTE OFADMINISTRATION:  IV   DIAGNOSIS:  Prostate cancer metastatic to bone   REFERRING PHYSICIAN: Jacqulyn Ducking, MD   TREATMENT #: 4   ADDITIONAL PHYSICIAN COMMENTS/NOTES:   AUTHORIZED USER SIGNATURE & TIME STAMP: Rennis Golden, MD   07/13/22    9:22 AM

## 2022-07-14 ENCOUNTER — Ambulatory Visit (HOSPITAL_COMMUNITY)
Admission: RE | Admit: 2022-07-14 | Discharge: 2022-07-14 | Disposition: A | Discharge status: Home / Self Care | Payer: Medicare Other | Source: Ambulatory Visit | Attending: Hematology and Oncology | Admitting: Hematology and Oncology

## 2022-07-14 DIAGNOSIS — C7951 Secondary malignant neoplasm of bone: Secondary | ICD-10-CM | POA: Diagnosis present

## 2022-07-14 DIAGNOSIS — C61 Malignant neoplasm of prostate: Secondary | ICD-10-CM | POA: Insufficient documentation

## 2022-07-14 MED ORDER — SODIUM CHLORIDE 0.9 % IV BOLUS: 1000.0000 mL | Freq: Once | INTRAVENOUS | Status: DC

## 2022-07-14 MED ORDER — LUTETIUM LU 177 VIPIVOTIDE TET 1000 MBQ/ML IV SOLN
197.0000 | Freq: Once | INTRAVENOUS | Status: AC
  Administered 2022-07-14: 197 via INTRAVENOUS

## 2022-07-14 NOTE — Progress Notes (Signed)
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 Patient status post androgen. deprivation and taxane chemotherapy.. Radiotracer avid metastatic prostate carcinoma identified on PSMA PET scan within the bones and pelvic lymph nodes. ]  EXAM: NUCLEAR MEDICINE PLUVICTO INJECTION  TECHNIQUE: Infusion: The nuclear medicine technologist and I personally verified the dose activity to be delivered as specified in the written directive, and verified the patient identification via 2 separate methods.  Initial flush of the intravenous catheter was performed was sterile saline. The dose syringe was connected to the catheter and the Lu-177 Pluvicto administered over a 1 to 10 min infusion. Single 10 cc  lushes with normal saline follow the dose. No complications were noted. The entire IV tubing, venocatheter, stopcock and syringes was removed in total, placed in a disposal bag and sent for assay of the residual activity, which will be reported at a later time in our EMR by the physics staff. Pressure was applied to the venipuncture site, and a compression bandage placed. Patient monitored for 1 hour following infusion.    Radiation Safety personnel were present to perform the discharge survey, as detailed on their documentation. After a short period of observation, the patient had his IV removed.  RADIOPHARMACEUTICALS: 197] microcuries Lu-177 PLUVICTO  FINDINGS: Current Infusion: [4]  Planned Infusions: 6    Patient presented to nuclear medicine for treatment. The patient's most recent blood counts were reviewed and remains a good candidate to proceed with Lu-177 Pluvicto.     Patient remains mildly anemic with hemoglobin equal 11.6.     PSA  elevated at 152.     The patient was situated in an infusion suite with a contact barrier placed under the arm. Intravenous access was established, using sterile  technique, and a normal saline infusion from a syringe was started.     Micro-dosimetry: The prescribed radiation activity was assayed and confirmed to be within specified tolerance.  IMPRESSION: Current Infusion: [4]  Planned Infusions: 6    [The patient tolerated the infusion well. The patient will return in 6 weeks for ongoing care.]

## 2022-08-24 ENCOUNTER — Ambulatory Visit (HOSPITAL_COMMUNITY)
Admission: RE | Admit: 2022-08-24 | Discharge: 2022-08-24 | Disposition: A | Discharge status: Home / Self Care | Payer: Medicare Other | Source: Ambulatory Visit | Attending: Hematology and Oncology | Admitting: Hematology and Oncology

## 2022-08-24 DIAGNOSIS — C61 Malignant neoplasm of prostate: Secondary | ICD-10-CM | POA: Diagnosis present

## 2022-08-24 DIAGNOSIS — C7951 Secondary malignant neoplasm of bone: Secondary | ICD-10-CM | POA: Insufficient documentation

## 2022-08-24 MED ORDER — SODIUM CHLORIDE 0.9 % IV BOLUS: 1000.0000 mL | Freq: Once | INTRAVENOUS | Status: DC

## 2022-08-24 MED ORDER — LUTETIUM LU 177 VIPIVOTIDE TET 1000 MBQ/ML IV SOLN
201.0000 | Freq: Once | INTRAVENOUS | Status: AC
  Administered 2022-08-24: 201 via INTRAVENOUS

## 2022-08-24 NOTE — Written Directive (Addendum)
MOLECULAR IMAGING AND THERAPEUTICS WRITTEN DIRECTIVE   PATIENT NAME: Luke Leblanc  PT DOB:   November 11, 1950                                              MRN: [id number]  ---------------------------------------------------------------------------------------------------------------------   PLUVICTO  THERAPY   RADIOPHARMACEUTICAL: Lutetium 177 vipivotide tetraxetan (Pluvicto)     PRESCRIBED DOSE FOR ADMINISTRATION:  200 mCi   ROUTE OFADMINISTRATION:  IV   DIAGNOSIS:  Prostate Cancer    REFERRING PHYSICIAN: Dr. Leonia Reeves   TREATMENT #:5   ADDITIONAL PHYSICIAN COMMENTS/NOTES:   AUTHORIZED USER SIGNATURE & TIME STAMP: Rennis Golden, MD   08/24/22    7:57 AM

## 2022-08-24 NOTE — Progress Notes (Signed)
CLINICAL DATA:  72 year old male with metastatic castrate resistant prostate carcinoma. Initial diagnosis 2017 with Gleason score 9. Patient status post androgen. deprivation and taxane chemotherapy.. Radiotracer avid metastatic prostate carcinoma identified on PSMA PET scan within the bones and pelvic lymph nodes.  EXAM: NUCLEAR MEDICINE PLUVICTO INJECTION  TECHNIQUE: Infusion: The nuclear medicine technologist and I personally verified the dose activity to be delivered as specified in the written directive, and verified the patient identification via 2 separate methods.  Initial flush of the intravenous catheter was performed was sterile saline. The dose syringe was connected to the catheter and the Lu-177 Pluvicto administered over a 1 to 10 min infusion. Single 10 cc  lushes with normal saline follow the dose. No complications were noted. The entire IV tubing, venocatheter, stopcock and syringes was removed in total, placed in a disposal bag and sent for assay of the residual activity, which will be reported at a later time in our EMR by the physics staff. Pressure was applied to the venipuncture site, and a compression bandage placed. Patient monitored for 1 hour following infusion.    Radiation Safety personnel were present to perform the discharge survey, as detailed on their documentation. After a short period of observation, the patient had his IV removed.  RADIOPHARMACEUTICALS: [Two hundred one] microcuries Lu-177 PLUVICTO  FINDINGS: Current Infusion: [5]  Planned Infusions: 6    Patient presented to nuclear medicine for treatment. The patient's most recent blood counts were reviewed and remains a good candidate to proceed with Lu-177 Pluvicto.     Mild anemia and decreased white blood cell count are stable.  Renal function and hepatic function normal.     Patient reports some weight loss.  No adverse effects reported from treatment.       PSA mildly trending  upwards at 230 increased from 150 on most recent comparison (07/04/2022).       The patient was situated in an infusion suite with a contact barrier placed under the arm. Intravenous access was established, using sterile technique, and a normal saline infusion from a syringe was started.     Micro-dosimetry: The prescribed radiation activity was assayed and confirmed to be within specified tolerance.  IMPRESSION: Current Infusion: [5]  Planned Infusions: 6    [The patient tolerated the infusion well. The patient will return in 6 weeks  month for ongoing care.]

## 2022-08-25 ENCOUNTER — Other Ambulatory Visit (HOSPITAL_COMMUNITY): Payer: Medicare Other

## 2022-10-04 NOTE — Written Directive (Addendum)
  PLUVICTO  THERAPY   RADIOPHARMACEUTICAL: Lutetium 177 vipivotide tetraxetan (Pluvicto)     PRESCRIBED DOSE FOR ADMINISTRATION:  200 mCi   ROUTE OFADMINISTRATION:  IV   DIAGNOSIS:  PROSTATE CANCER   REFERRING PHYSICIAN: Jacqulyn Ducking, MD   TREATMENT #: 6   ADDITIONAL PHYSICIAN COMMENTS/NOTES:   AUTHORIZED USER SIGNATURE & TIME STAMP: Rennis Golden, MD   10/04/22    12:26 PM

## 2022-10-05 ENCOUNTER — Ambulatory Visit (HOSPITAL_COMMUNITY)
Admission: RE | Admit: 2022-10-05 | Discharge: 2022-10-05 | Disposition: A | Discharge status: Home / Self Care | Payer: Medicare Other | Source: Ambulatory Visit | Attending: Hematology and Oncology | Admitting: Hematology and Oncology

## 2022-10-05 VITALS — BP 126/81 | HR 93 | Resp 20

## 2022-10-05 DIAGNOSIS — C61 Malignant neoplasm of prostate: Secondary | ICD-10-CM | POA: Insufficient documentation

## 2022-10-05 DIAGNOSIS — C7951 Secondary malignant neoplasm of bone: Secondary | ICD-10-CM | POA: Diagnosis present

## 2022-10-05 LAB — CBC
HCT: 33.4 % — ABNORMAL LOW (ref 39.0–52.0)
Hemoglobin: 10.8 g/dL — ABNORMAL LOW (ref 13.0–17.0)
MCH: 29.8 pg (ref 26.0–34.0)
MCHC: 32.3 g/dL (ref 30.0–36.0)
MCV: 92.3 fL (ref 80.0–100.0)
Platelets: 217 10*3/uL (ref 150–400)
RBC: 3.62 MIL/uL — ABNORMAL LOW (ref 4.22–5.81)
RDW: 13.5 % (ref 11.5–15.5)
WBC: 4.2 10*3/uL (ref 4.0–10.5)
nRBC: 0 % (ref 0.0–0.2)

## 2022-10-05 LAB — BASIC METABOLIC PANEL
Anion gap: 9 (ref 5–15)
BUN: 13 mg/dL (ref 8–23)
CO2: 25 mmol/L (ref 22–32)
Calcium: 9.2 mg/dL (ref 8.9–10.3)
Chloride: 106 mmol/L (ref 98–111)
Creatinine, Ser: 0.72 mg/dL (ref 0.61–1.24)
GFR, Estimated: >60 mL/min (ref >60)
Glucose, Bld: 104 mg/dL — ABNORMAL HIGH (ref 70–99)
Potassium: 3.8 mmol/L (ref 3.5–5.1)
Sodium: 140 mmol/L (ref 135–145)

## 2022-10-05 LAB — PSA: Prostatic Specific Antigen: 289.13 ng/mL — ABNORMAL HIGH (ref 0.00–4.00)

## 2022-10-05 MED ORDER — LUTETIUM LU 177 VIPIVOTIDE TET 1000 MBQ/ML IV SOLN: 202.8000 | Freq: Once | INTRAVENOUS | Status: DC

## 2022-10-05 MED ORDER — SODIUM CHLORIDE 0.9 % IV SOLN: INTRAVENOUS | Status: DC

## 2022-10-05 MED ORDER — SODIUM CHLORIDE 0.9 % IV BOLUS: 1000.0000 mL | Freq: Once | INTRAVENOUS | Status: DC

## 2022-10-05 NOTE — Progress Notes (Signed)
CLINICAL DATA: [ 72 year old male with metastatic castrate resistant prostate carcinoma. Initial diagnosis 2017 with Gleason score 9. Patient status post androgen. deprivation and taxane chemotherapy.. Radiotracer avid metastatic prostate carcinoma identified on PSMA PET scan within the bones and pelvic lymph nodes.]  EXAM: NUCLEAR MEDICINE PLUVICTO INJECTION  TECHNIQUE: Infusion: The nuclear medicine technologist and I personally verified the dose activity to be delivered as specified in the written directive, and verified the patient identification via 2 separate methods.  Initial flush of the intravenous catheter was performed was sterile saline. The dose syringe was connected to the catheter and the Lu-177 Pluvicto administered over a 1 to 10 min infusion. Single 10 cc  lushes with normal saline follow the dose. No complications were noted. The entire IV tubing, venocatheter, stopcock and syringes was removed in total, placed in a disposal bag and sent for assay of the residual activity, which will be reported at a later time in our EMR by the physics staff. Pressure was applied to the venipuncture site, and a compression bandage placed. Patient monitored for 1 hour following infusion.    Radiation Safety personnel were present to perform the discharge survey, as detailed on their documentation. After a short period of observation, the patient had his IV removed.  RADIOPHARMACEUTICALS: [202.8] microcuries Lu-177 PLUVICTO  FINDINGS: Current Infusion: [6]  Planned Infusions: 6    Patient presented to nuclear medicine for treatment. The patient's most recent blood counts were reviewed and remains a good candidate to proceed with Lu-177 Pluvicto.     Patient is mildly anemic and unchanged during treatment.      Renal function and liver function normal.      Patient does report RIGHT hip pain.  Dull pain.      Some loss of appetite.     The patient was situated in an  infusion suite with a contact barrier placed under the arm. Intravenous access was established, using sterile technique, and a normal saline infusion from a syringe was started.     Micro-dosimetry: The prescribed radiation activity was assayed and confirmed to be within specified tolerance.  IMPRESSION: Current Infusion: [6]  Planned Infusions: 6    [Patient tolerated the full 6 treatments of PSMA Lu 177 well without evidence of further myelosuppression or renal injury.  Consider follow-up PSMA PET scan to evaluate increased LEFT hip pain as well as treatment response.]

## 2022-10-06 ENCOUNTER — Other Ambulatory Visit (HOSPITAL_COMMUNITY): Payer: Medicare Other

## 2023-06-26 IMAGING — MR MR MRA NECK WO/W CM
4 of 6 series · 26 of 48 positions shown · IV contrast (Gadavist)
Comparison: No prior MRI, correlation is made with CT head
01/09/2022

CLINICAL DATA: Dizziness, nausea vomiting difficulty walking

EXAM:
MRI HEAD WITHOUT CONTRAST
MRA HEAD WITHOUT CONTRAST
MRA NECK WITHOUT AND WITH CONTRAST
TECHNIQUE: Multiplanar, multi-echo pulse sequences of the brain and surrounding
structures were acquired without intravenous contrast. Angiographic
images of the Circle of Willis were acquired using MRA technique
without intravenous contrast. Angiographic images of the neck were
acquired using MRA technique without and with intravenous contrast.
Carotid stenosis measurements (when applicable) are obtained
utilizing NASCET criteria, using the distal internal carotid
diameter as the denominator.
CONTRAST:  8mL GADAVIST GADOBUTROL 1 MMOL/ML IV SOLN

[Series 7: tof_fl3d_tra_iso · axial · B · 0.6mm · 0.52mm/px · z∈[-109,+29]mm · 9 of 257 slices shown]
[im 13/257]
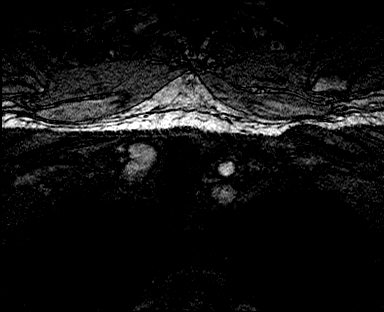
[im 39/257]
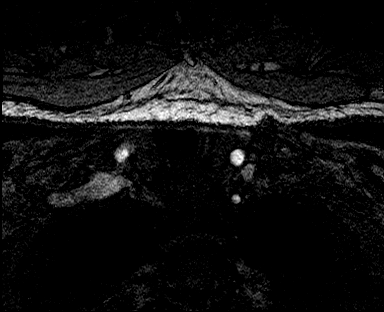
[im 77/257]
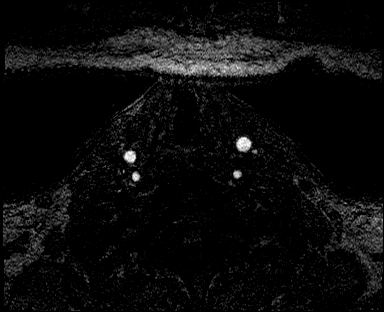
[im 116/257]
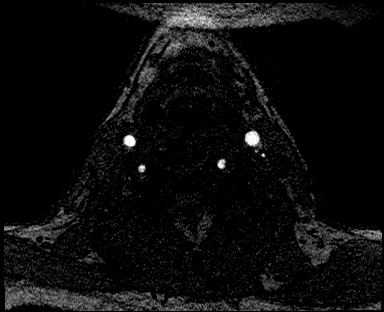
[im 129/257]
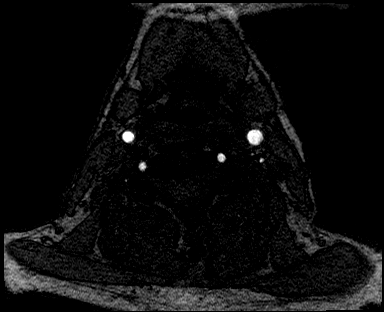
[im 141/257]
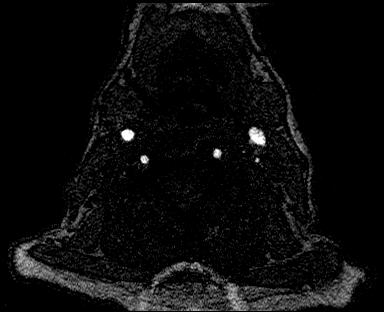
[im 180/257]
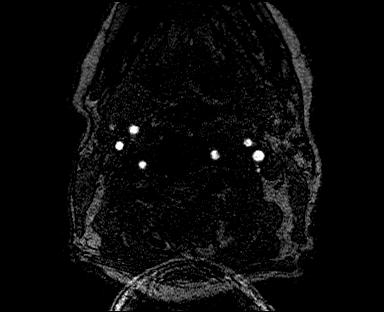
[im 218/257]
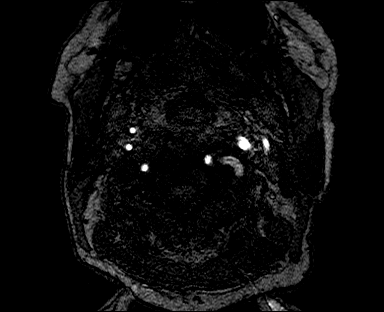
[im 244/257]
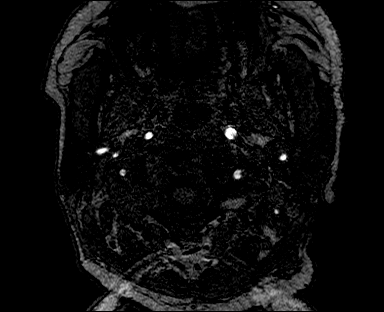

[Series 10: angio_fl3d_cor_highres_pre_ttc=3.0s · coronal · B · 0.9mm · 0.62mm/px · 6 of 80 slices shown]
[im 1/80]
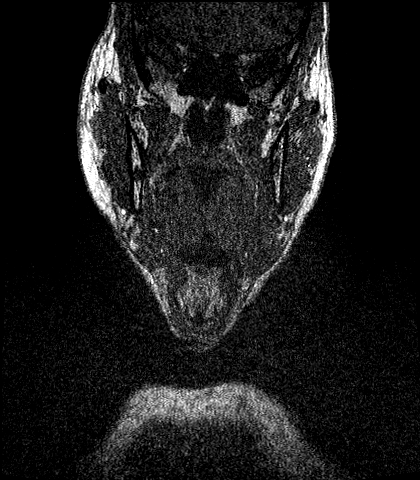
[im 16/80]
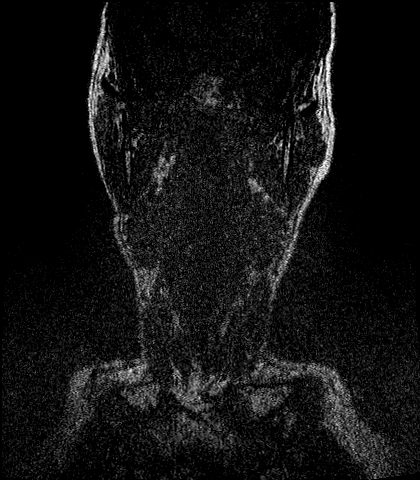
[im 32/80]
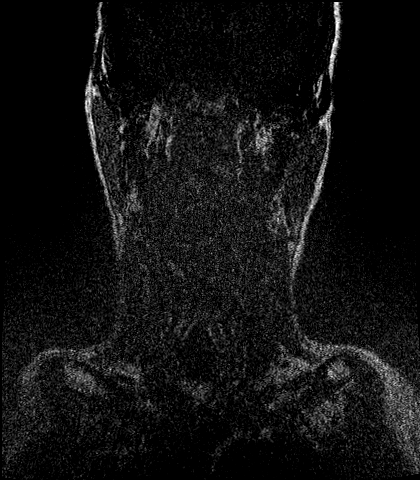
[im 48/80]
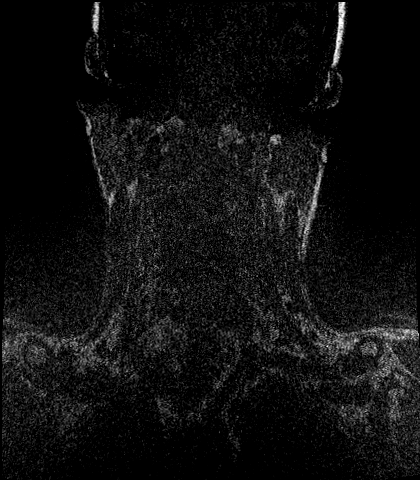
[im 64/80]
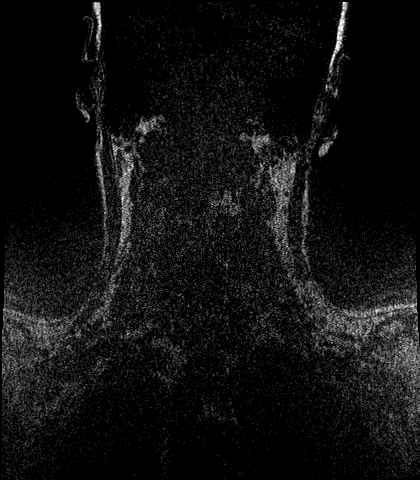
[im 80/80]
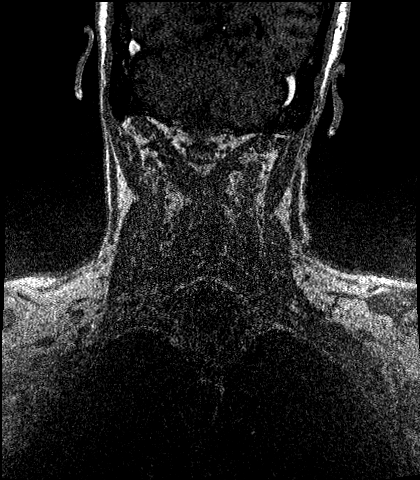

[Series 12: angio_fl3d_cor_highres_post_ttc=3.0s · coronal · B · 0.9mm · 0.62mm/px · 6 of 80 slices shown]
[im 1/80]
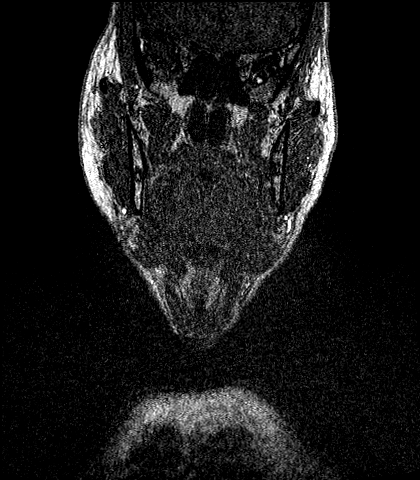
[im 16/80]
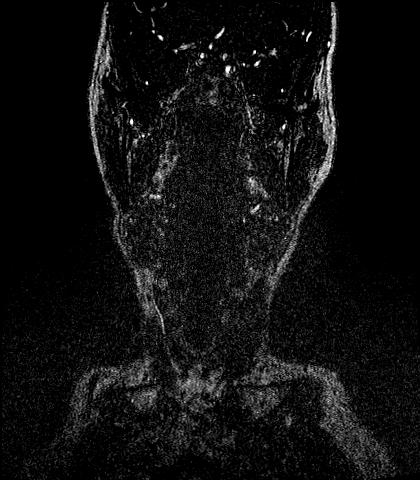
[im 32/80]
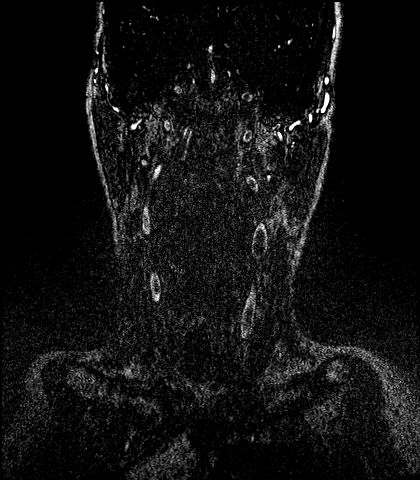
[im 48/80]
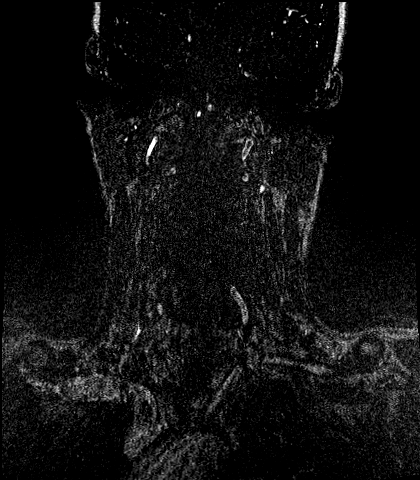
[im 64/80]
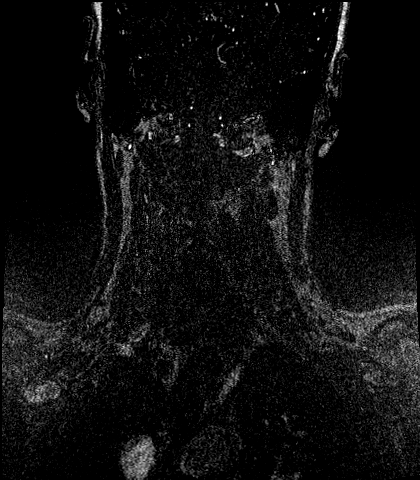
[im 80/80]
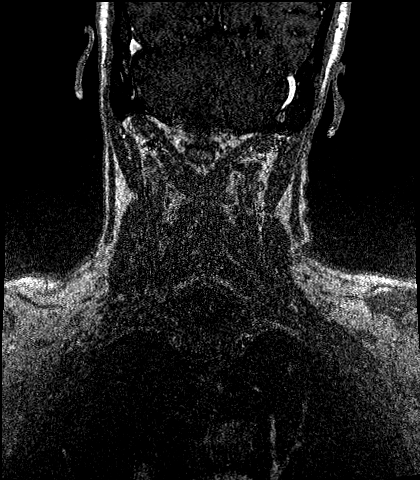

[Series 13: angio_fl3d_cor_highres_post_ttc=3.0s_moco-adv · coronal · B · 0.9mm · 0.62mm/px · 5 of 80 slices shown]
[im 1/80]
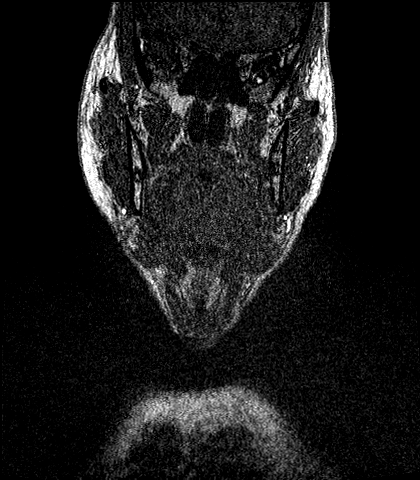
[im 16/80]
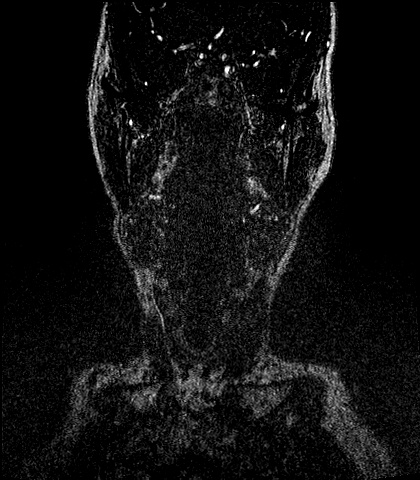
[im 32/80]
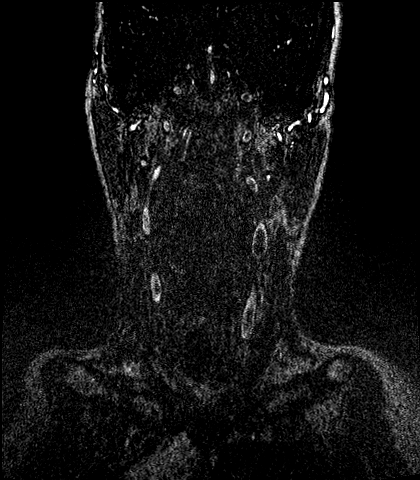
[im 48/80]
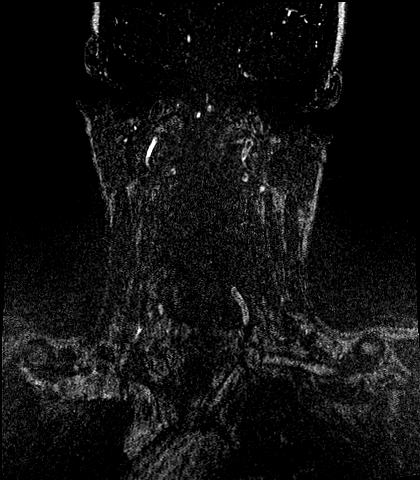
[im 80/80]
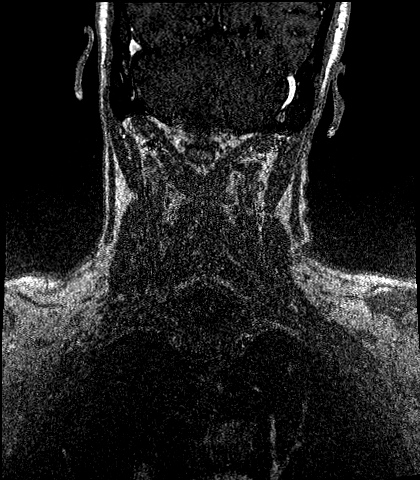

[26 of 48 positions shown; findings below may reference images not displayed]

FINDINGS: MRI HEAD FINDINGS

Brain: No restricted diffusion to suggest acute or subacute infarct.
No acute hemorrhage, mass, mass effect, or midline shift. No
hydrocephalus or extra-axial collection. Overall cerebral volume is
within normal limits for age. No hemosiderin deposition to suggest
remote hemorrhage. Confluent T2 hyperintense signal in the
periventricular white matter and pons, likely the sequela of
moderate to severe chronic small vessel ischemic disease.

Vascular: Please see MRA findings below

Skull and upper cervical spine: Normal marrow signal.

Sinuses/Orbits: Mucous retention cysts in the left maxillary sinus.
Mucosal thickening in the ethmoid air cells. The orbits are
unremarkable.

Other: Fluid in the mastoid air cells.

MRA HEAD FINDINGS

Anterior circulation: Both internal carotid arteries are patent to
the termini. Moderate narrowing in the distal right cavernous ICA
and proximal right supraclinoid ICA. Mild-to-moderate narrowing in
the left cavernous and supraclinoid ICA.

Patent left A1. Severe stenosis of the right A1, just distal to its
takeoff. Normal anterior communicating artery. Anterior cerebral
arteries are patent to their distal aspects.

No M1 stenosis or occlusion. Normal MCA bifurcations. Distal MCA
branches perfused and symmetric.

Posterior circulation: Distal vertebral arteries patent to the
vertebrobasilar junction without stenosis. Posterior inferior
cerebral arteries patent bilaterally.

Basilar patent to its distal aspect, with a possible proximal
fenestration. Superior cerebellar arteries patent bilaterally.

Patent P1 segments. Mild focal narrowing in the proximal left P2
segment and just proximal to the left P3 bifurcation. PCAs otherwise
perfused to their distal aspects without stenosis. The bilateral
posterior communicating arteries are not visualized.

Anatomic variants: None significant

MRA NECK FINDINGS

The patient paused the exam as contrast was being administered,
causing the bolus to be primarily venous, limiting evaluation. The
noncontrast time-of-flight was successfully performed.

Aortic arch: Limited evaluation.  Standard aortic branching.

Right carotid system: No evidence of dissection, occlusion, or
hemodynamically significant stenosis (greater than 50%).

Left carotid system: No evidence of dissection, occlusion, or
hemodynamically significant stenosis (greater than 50%).

Vertebral arteries: No evidence of dissection, occlusion, or
hemodynamically significant stenosis (greater than 50%).

Other: None
IMPRESSION: 1. No acute intracranial process. No evidence of acute or subacute
infarct.
2. No intracranial large vessel occlusion. Severe stenosis in the
right A1 segment, moderate narrowing in the right cavernous and
supraclinoid ICA, mild-to-moderate narrowing in the left cavernous
and supraclinoid ICA, and mild narrowing in the left P2 segments.
3. Evaluation of the neck vasculature is somewhat limited by bolus
timing. On the noncontrast sequence, there is no hemodynamically
significant stenosis in the bilateral carotid and vertebral
arteries. If there is persistent concern, consider a CTA of the head
and neck.

## 2023-06-26 IMAGING — MR MR MRA HEAD W/O CM
1 series · 17 of 48 positions shown · IV contrast (gadavist)
Comparison: No prior MRI, correlation is made with CT head
01/09/2022

CLINICAL DATA: Dizziness, nausea vomiting difficulty walking

EXAM:
MRI HEAD WITHOUT CONTRAST
MRA HEAD WITHOUT CONTRAST
MRA NECK WITHOUT AND WITH CONTRAST
TECHNIQUE: Multiplanar, multi-echo pulse sequences of the brain and surrounding
structures were acquired without intravenous contrast. Angiographic
images of the Circle of Willis were acquired using MRA technique
without intravenous contrast. Angiographic images of the neck were
acquired using MRA technique without and with intravenous contrast.
Carotid stenosis measurements (when applicable) are obtained
utilizing NASCET criteria, using the distal internal carotid
diameter as the denominator.
CONTRAST:  8mL GADAVIST GADOBUTROL 1 MMOL/ML IV SOLN

[Series 9: 3d cow · axial · 0.5mm · 0.41mm/px · z∈[-42,+34]mm · 17 of 160 slices shown]
[im 1/160]
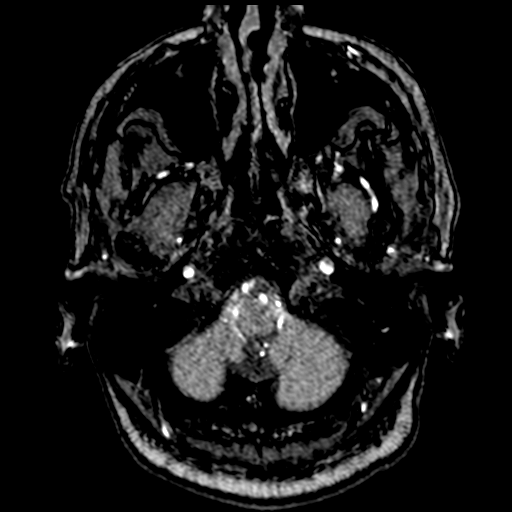
[im 4/160]
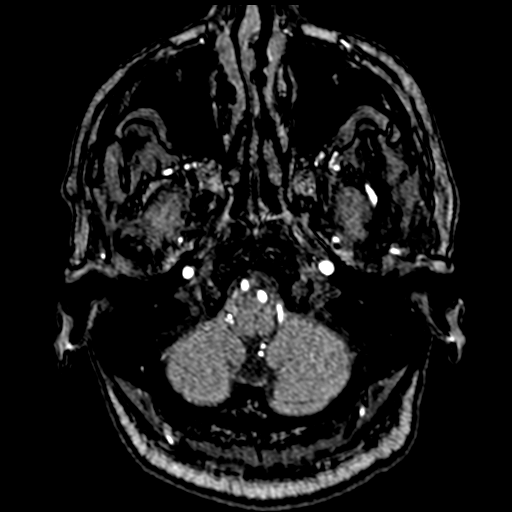
[im 7/160]
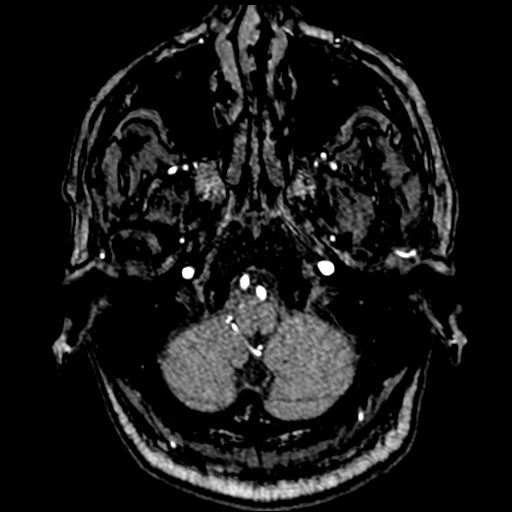
[im 11/160]
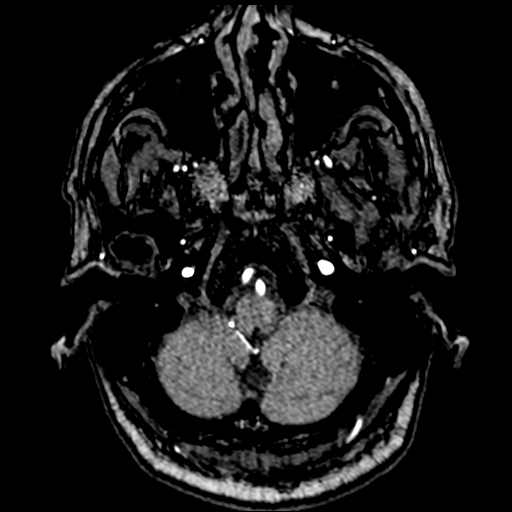
[im 14/160]
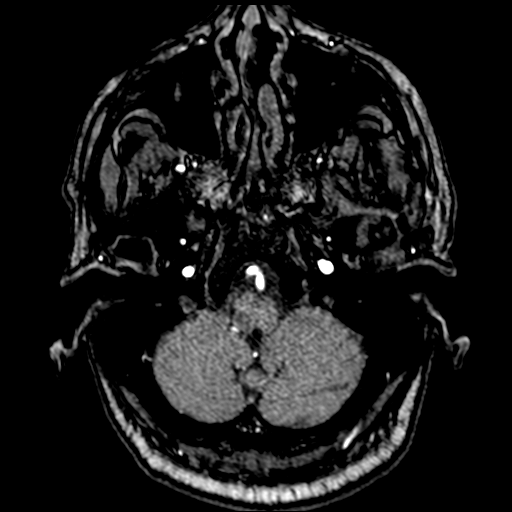
[im 17/160]
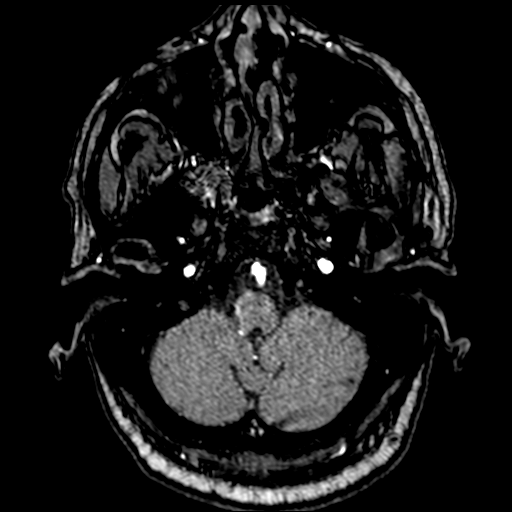
[im 21/160]
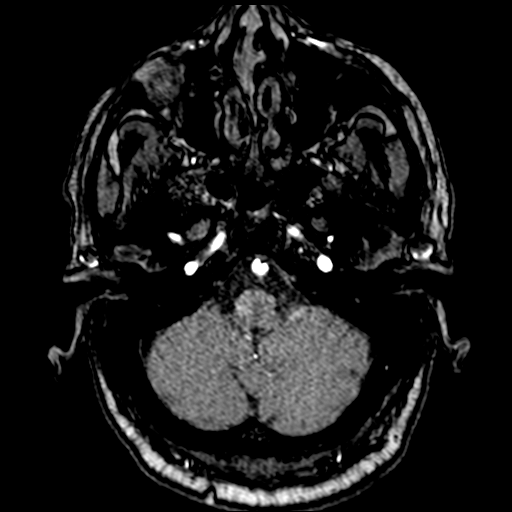
[im 28/160]
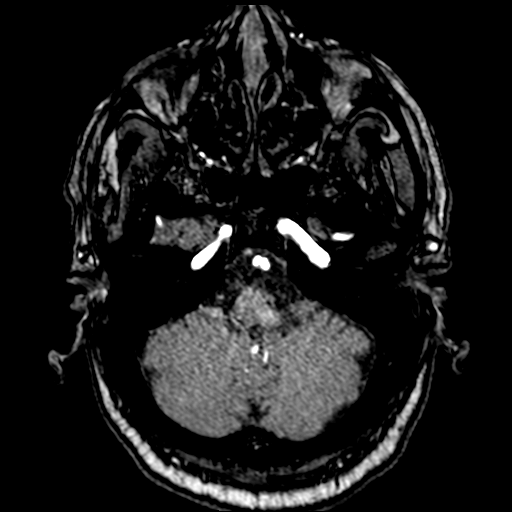
[im 31/160]
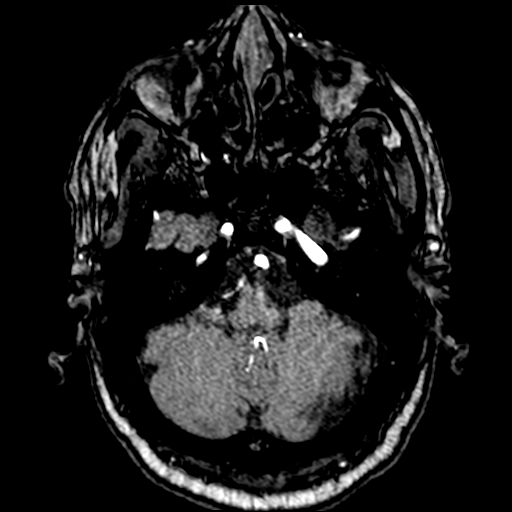
[im 51/160]
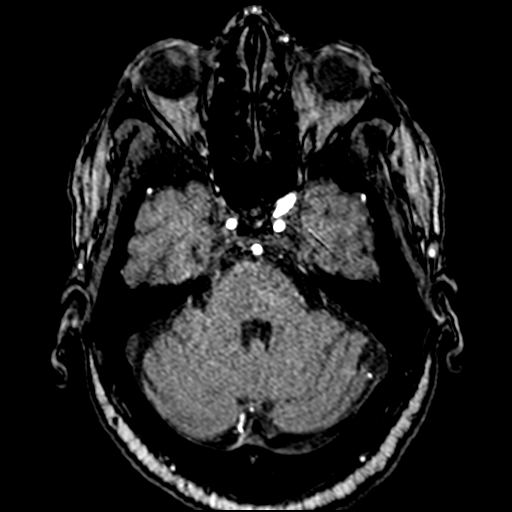
[im 72/160]
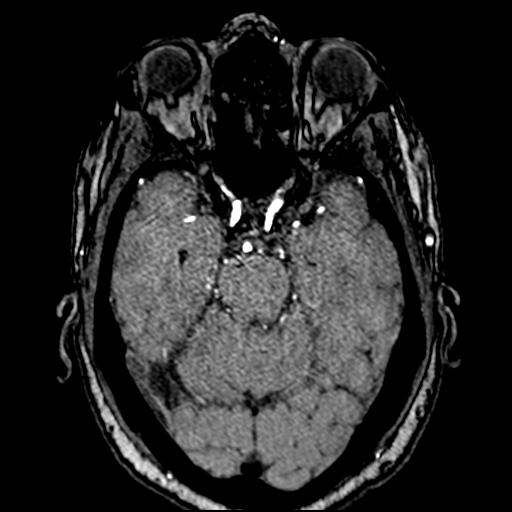
[im 82/160]
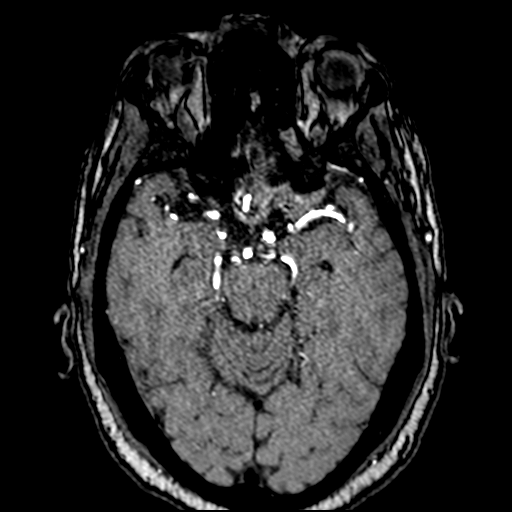
[im 92/160]
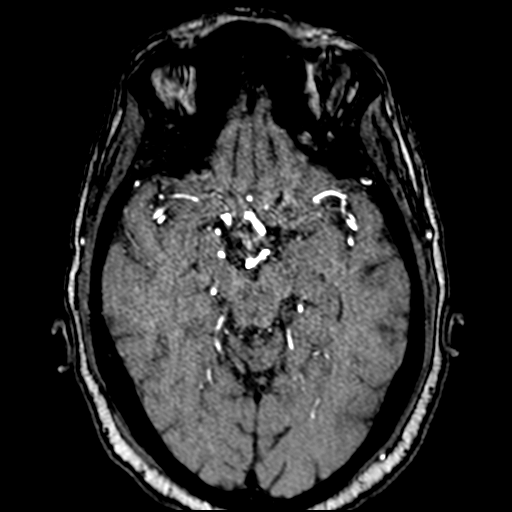
[im 112/160]
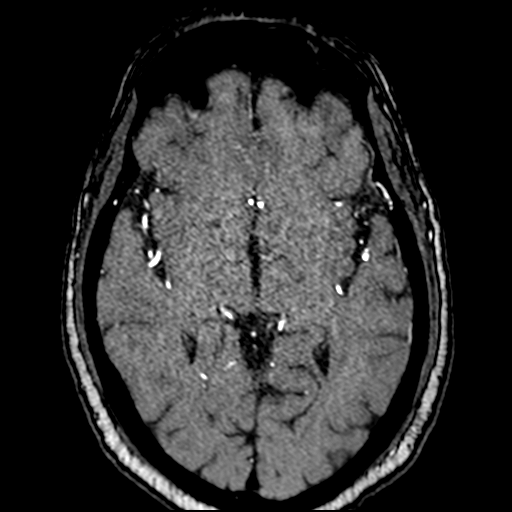
[im 132/160]
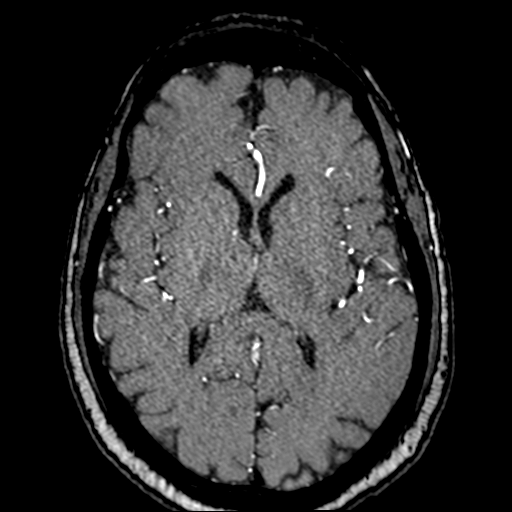
[im 136/160]
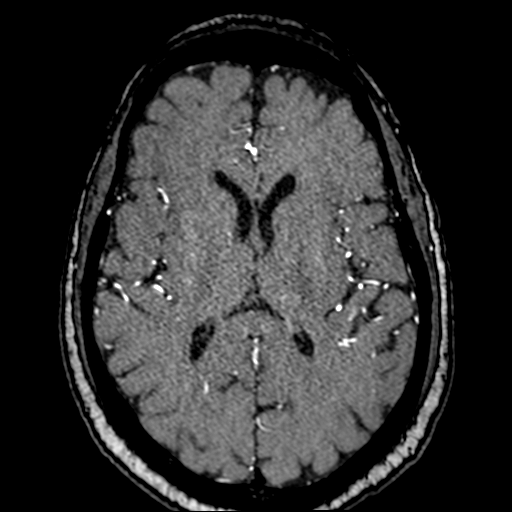
[im 153/160]
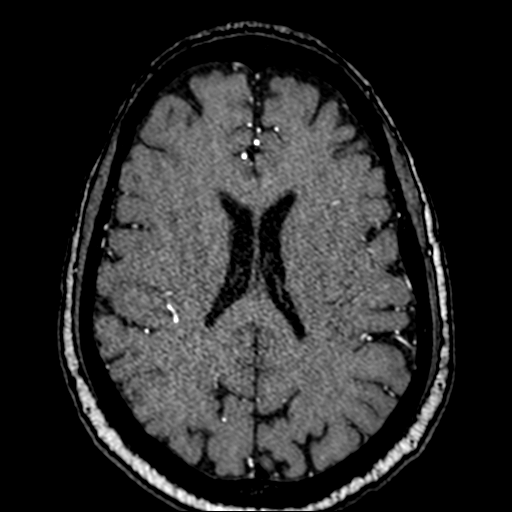

[17 of 48 positions shown; findings below may reference images not displayed]

FINDINGS: MRI HEAD FINDINGS

Brain: No restricted diffusion to suggest acute or subacute infarct.
No acute hemorrhage, mass, mass effect, or midline shift. No
hydrocephalus or extra-axial collection. Overall cerebral volume is
within normal limits for age. No hemosiderin deposition to suggest
remote hemorrhage. Confluent T2 hyperintense signal in the
periventricular white matter and pons, likely the sequela of
moderate to severe chronic small vessel ischemic disease.

Vascular: Please see MRA findings below

Skull and upper cervical spine: Normal marrow signal.

Sinuses/Orbits: Mucous retention cysts in the left maxillary sinus.
Mucosal thickening in the ethmoid air cells. The orbits are
unremarkable.

Other: Fluid in the mastoid air cells.

MRA HEAD FINDINGS

Anterior circulation: Both internal carotid arteries are patent to
the termini. Moderate narrowing in the distal right cavernous ICA
and proximal right supraclinoid ICA. Mild-to-moderate narrowing in
the left cavernous and supraclinoid ICA.

Patent left A1. Severe stenosis of the right A1, just distal to its
takeoff. Normal anterior communicating artery. Anterior cerebral
arteries are patent to their distal aspects.

No M1 stenosis or occlusion. Normal MCA bifurcations. Distal MCA
branches perfused and symmetric.

Posterior circulation: Distal vertebral arteries patent to the
vertebrobasilar junction without stenosis. Posterior inferior
cerebral arteries patent bilaterally.

Basilar patent to its distal aspect, with a possible proximal
fenestration. Superior cerebellar arteries patent bilaterally.

Patent P1 segments. Mild focal narrowing in the proximal left P2
segment and just proximal to the left P3 bifurcation. PCAs otherwise
perfused to their distal aspects without stenosis. The bilateral
posterior communicating arteries are not visualized.

Anatomic variants: None significant

MRA NECK FINDINGS

The patient paused the exam as contrast was being administered,
causing the bolus to be primarily venous, limiting evaluation. The
noncontrast time-of-flight was successfully performed.

Aortic arch: Limited evaluation.  Standard aortic branching.

Right carotid system: No evidence of dissection, occlusion, or
hemodynamically significant stenosis (greater than 50%).

Left carotid system: No evidence of dissection, occlusion, or
hemodynamically significant stenosis (greater than 50%).

Vertebral arteries: No evidence of dissection, occlusion, or
hemodynamically significant stenosis (greater than 50%).

Other: None
IMPRESSION: 1. No acute intracranial process. No evidence of acute or subacute
infarct.
2. No intracranial large vessel occlusion. Severe stenosis in the
right A1 segment, moderate narrowing in the right cavernous and
supraclinoid ICA, mild-to-moderate narrowing in the left cavernous
and supraclinoid ICA, and mild narrowing in the left P2 segments.
3. Evaluation of the neck vasculature is somewhat limited by bolus
timing. On the noncontrast sequence, there is no hemodynamically
significant stenosis in the bilateral carotid and vertebral
arteries. If there is persistent concern, consider a CTA of the head
and neck.
